# Patient Record
Sex: Female | Born: 1995 | Race: White | Hispanic: No | Marital: Single | Smoking: Light tobacco smoker
Health system: Southern US, Community
[De-identification: ages and names within clinical notes are randomized; demographics above are authoritative.]

## PROBLEM LIST (undated history)

## (undated) DIAGNOSIS — K5900 Constipation, unspecified: Secondary | ICD-10-CM

## (undated) DIAGNOSIS — K589 Irritable bowel syndrome without diarrhea: Secondary | ICD-10-CM

## (undated) DIAGNOSIS — R197 Diarrhea, unspecified: Secondary | ICD-10-CM

## (undated) DIAGNOSIS — B2790 Infectious mononucleosis, unspecified without complication: Secondary | ICD-10-CM

## (undated) HISTORY — DX: Diarrhea, unspecified: R19.7

## (undated) HISTORY — DX: Infectious mononucleosis, unspecified without complication: B27.90

## (undated) HISTORY — DX: Irritable bowel syndrome, unspecified: K58.9

## (undated) HISTORY — DX: Constipation, unspecified: K59.00

---
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
## 2017-06-26 ENCOUNTER — Telehealth: Payer: Self-pay | Admitting: Physician Assistant

## 2017-06-26 NOTE — Telephone Encounter (Signed)
Spoke to mother, patient had been originally scheduled with Dr. Adela LankArmbruster for a colonoscopy on 12/13/16, patient had to cancel due to being sick (mono). Patient is at camp until 8/6 and then will leave to go back to school the following week. I have scheduled a pre-visit on 8/7 [facility] Patient already has prep, just needs pre-visit instructions. I will let Amy H. know about rescheduled date to get prior auth from insurance if needed.

## 2017-07-16 ENCOUNTER — Ambulatory Visit (AMBULATORY_SURGERY_CENTER): Payer: Self-pay

## 2017-07-16 VITALS — Ht 64.0 in | Wt 134.8 lb

## 2017-07-16 DIAGNOSIS — R197 Diarrhea, unspecified: Secondary | ICD-10-CM

## 2017-07-16 NOTE — Progress Notes (Signed)
No allergies to eggs or soy No past exposure to anesthesia No diet meds No home oxygen  Registered emmi 

## 2017-07-17 ENCOUNTER — Ambulatory Visit (AMBULATORY_SURGERY_CENTER): Payer: BC Managed Care – PPO | Admitting: Gastroenterology

## 2017-07-17 VITALS — BP 103/65 | HR 85 | Temp 97.8°F | Resp 19 | Ht 64.0 in | Wt 134.0 lb

## 2017-07-17 DIAGNOSIS — R197 Diarrhea, unspecified: Secondary | ICD-10-CM | POA: Diagnosis not present

## 2017-07-17 MED ORDER — SODIUM CHLORIDE 0.9 % IV SOLN: 500.0000 mL | INTRAVENOUS | Status: DC

## 2017-07-17 NOTE — Progress Notes (Signed)
Pt's states no medical or surgical changes since previsit or office visit. 

## 2017-07-17 NOTE — Progress Notes (Signed)
Report to PACU, RN, vss, BBS= Clear.  

## 2017-07-17 NOTE — Progress Notes (Signed)
Called to room to assist during endoscopic procedure.  Patient ID and intended procedure confirmed with present staff. Received instructions for my participation in the procedure from the performing physician.  

## 2017-07-17 NOTE — Patient Instructions (Signed)
YOU HAD AN ENDOSCOPIC PROCEDURE TODAY AT THE [facility]:   Refer to the procedure report that was given to you for any specific questions about what was found during the examination.  If the procedure report does not answer your questions, please call your gastroenterologist to clarify.  If you requested that your care partner not be given the details of your procedure findings, then the procedure report has been included in a sealed envelope for you to review at your convenience later.  YOU SHOULD EXPECT: Some feelings of bloating in the abdomen. Passage of more gas than usual.  Walking can help get rid of the air that was put into your GI tract during the procedure and reduce the bloating. If you had a lower endoscopy (such as a colonoscopy or flexible sigmoidoscopy) you may notice spotting of blood in your stool or on the toilet paper. If you underwent a bowel prep for your procedure, you may not have a normal bowel movement for a few days.  Please Note:  You might notice some irritation and congestion in your nose or some drainage.  This is from the oxygen used during your procedure.  There is no need for concern and it should clear up in a day or so.  SYMPTOMS TO REPORT IMMEDIATELY:   Following lower endoscopy (colonoscopy or flexible sigmoidoscopy):  Excessive amounts of blood in the stool  Significant tenderness or worsening of abdominal pains  Swelling of the abdomen that is new, acute  Fever of 100F or higher   Following upper endoscopy (EGD)  Vomiting of blood or coffee ground material  New chest pain or pain under the shoulder blades  Painful or persistently difficult swallowing  New shortness of breath  Fever of 100F or higher  Black, tarry-looking stools  For urgent or emergent issues, a gastroenterologist can be reached at any hour by calling [phone].   DIET:  We do recommend a small meal at first, but then you may proceed to your regular diet.  Drink  plenty of fluids but you should avoid alcoholic beverages for 24 hours.  ACTIVITY:  You should plan to take it easy for the rest of today and you should NOT DRIVE or use heavy machinery until tomorrow (because of the sedation medicines used during the test).    FOLLOW UP: Our staff will call the number listed on your records the next business day following your procedure to check on you and address any questions or concerns that you may have regarding the information given to you following your procedure. If we do not reach you, we will leave a message.  However, if you are feeling well and you are not experiencing any problems, there is no need to return our call.  We will assume that you have returned to your regular daily activities without incident.  If any biopsies were taken you will be contacted by phone or by letter within the next 1-3 weeks.  Please call us at [phone] if you have not heard about the biopsies in 3 weeks.    SIGNATURES/CONFIDENTIALITY: You and/or your care partner have signed paperwork which will be entered into your electronic medical record.  These signatures attest to the fact that that the information above on your After Visit Summary has been reviewed and is understood.  Full responsibility of the confidentiality of this discharge information lies with you and/or your care-partner.  Hemorrhoid and low fodmap diet information given,  May use daily  fiber supplement.  Await pathology results.

## 2017-07-17 NOTE — Op Note (Signed)
[facility] Patient Name: Jordan HarrisKenzie Emel Procedure Date: 07/17/2017 7:34 AM MRN: [id number] Endoscopist: Viviann SpareSteven P. Armbruster MD, MD Age: [age] Referring MD:  Date of Birth: 09/03/1996 Gender: Female Account #: [id number] Procedure:                Colonoscopy Indications:              Chronic diarrhea, history of hematochezia Medicines:                Monitored Anesthesia Care Procedure:                Pre-Anesthesia Assessment:                           - Prior to the procedure, a History and Physical                            was performed, and patient medications and                            allergies were reviewed. The patient's tolerance of                            previous anesthesia was also reviewed. The risks                            and benefits of the procedure and the sedation                            options and risks were discussed with the patient.                            All questions were answered, and informed consent                            was obtained. Prior Anticoagulants: The patient has                            taken no previous anticoagulant or antiplatelet                            agents. ASA Grade Assessment: II - A patient with                            mild systemic disease. After reviewing the risks                            and benefits, the patient was deemed in                            satisfactory condition to undergo the procedure.                           After obtaining informed consent, the colonoscope  was passed under direct vision. Throughout the                            procedure, the patient's blood pressure, pulse, and                            oxygen saturations were monitored continuously. The                            Model PCF-H190DL [id number]) scope was introduced                            through the anus and advanced to the the terminal                            ileum,  with identification of the appendiceal                            orifice and IC valve. The colonoscopy was performed                            without difficulty. The patient tolerated the                            procedure well. The quality of the bowel                            preparation was good. The terminal ileum, ileocecal                            valve, appendiceal orifice, and rectum were                            photographed. Scope In: 7:38:55 AM Scope Out: 7:51:58 AM Scope Withdrawal Time: 0 hours 9 minutes 44 seconds  Total Procedure Duration: 0 hours 13 minutes 3 seconds  Findings:                 The perianal and digital rectal examinations were                            normal.                           The terminal ileum appeared normal.                           Internal hemorrhoids were found during                            retroflexion. The hemorrhoids were small.                           The exam was otherwise without abnormality. No  overt inflammatory changes.                           Biopsies for histology were taken with a cold                            forceps from the right colon, left colon and                            transverse colon for evaluation of microscopic                            colitis. Complications:            No immediate complications. Estimated blood loss:                            Minimal. Estimated Blood Loss:     Estimated blood loss was minimal. Impression:               - The examined portion of the ileum was normal.                           - Internal hemorrhoids.                           - The examination was otherwise normal.                           - Biopsies were taken with a cold forceps from the                            right colon, left colon and transverse colon for                            evaluation of microscopic colitis. Recommendation:           - Patient has a contact  number available for                            emergencies. The signs and symptoms of potential                            delayed complications were discussed with the                            patient. Return to normal activities tomorrow.                            Written discharge instructions were provided to the                            patient.                           - Resume previous diet.                           -  Continue present medications.                           - Trial of immodium as needed for diarrhea, bentyl                            10mg  every 8 hours as needed for cramping. Will                            discuss other medication options with the patient                           - Await pathology results. Viviann Spare P. Armbruster MD, MD 07/17/2017 7:56:39 AM This report has been signed electronically.

## 2017-07-18 ENCOUNTER — Telehealth: Payer: Self-pay | Admitting: *Deleted

## 2017-07-18 NOTE — Telephone Encounter (Signed)
  Follow up Call-  Call back number 07/17/2017  Post procedure Call Back phone  # [phone]  Permission to leave phone message Yes  Some recent data might be hidden     Patient questions:  Do you have a fever, pain , or abdominal swelling? No. Pain Score  0 *  Have you tolerated food without any problems? Yes.    Have you been able to return to your normal activities? Yes.    Do you have any questions about your discharge instructions: Diet   No. Medications  No. Follow up visit  No.  Do you have questions or concerns about your Care? No.  Actions: * If pain score is 4 or above: No action needed, pain <4.

## 2017-07-18 NOTE — Telephone Encounter (Signed)
  Follow up Call-  Call back number 07/17/2017  Post procedure Call Back phone  # [phone]  Permission to leave phone message Yes  Some recent data might be hidden     Patient questions:  Message left to call us if necessray.

## 2017-07-19 ENCOUNTER — Other Ambulatory Visit: Payer: Self-pay

## 2019-12-02 ENCOUNTER — Other Ambulatory Visit: Payer: Self-pay

## 2019-12-02 ENCOUNTER — Ambulatory Visit
Admission: EM | Admit: 2019-12-02 | Discharge: 2019-12-02 | Disposition: A | Discharge status: Home / Self Care | Payer: BC Managed Care – PPO | Attending: Family Medicine | Admitting: Family Medicine

## 2019-12-02 ENCOUNTER — Encounter: Payer: Self-pay | Admitting: Emergency Medicine

## 2019-12-02 DIAGNOSIS — N390 Urinary tract infection, site not specified: Secondary | ICD-10-CM

## 2019-12-02 DIAGNOSIS — N760 Acute vaginitis: Secondary | ICD-10-CM | POA: Diagnosis not present

## 2019-12-02 DIAGNOSIS — B9689 Other specified bacterial agents as the cause of diseases classified elsewhere: Secondary | ICD-10-CM

## 2019-12-02 DIAGNOSIS — Z113 Encounter for screening for infections with a predominantly sexual mode of transmission: Secondary | ICD-10-CM

## 2019-12-02 LAB — WET PREP, GENITAL
Sperm: NONE SEEN
Trich, Wet Prep: NONE SEEN
Yeast Wet Prep HPF POC: NONE SEEN

## 2019-12-02 LAB — URINALYSIS, COMPLETE (UACMP) WITH MICROSCOPIC
Bilirubin Urine: NEGATIVE
Glucose, UA: NEGATIVE mg/dL
Ketones, ur: NEGATIVE mg/dL
Nitrite: NEGATIVE
Protein, ur: NEGATIVE mg/dL
Specific Gravity, Urine: 1.015 (ref 1.005–1.030)
WBC, UA: >50 WBC/hpf (ref 0–5)
pH: 6 (ref 5.0–8.0)

## 2019-12-02 MED ORDER — METRONIDAZOLE 500 MG PO TABS: 500.0000 mg | ORAL_TABLET | Freq: Two times a day (BID) | ORAL | 0 refills | Status: DC

## 2019-12-02 MED ORDER — SULFAMETHOXAZOLE-TRIMETHOPRIM 800-160 MG PO TABS: 1.0000 | ORAL_TABLET | Freq: Two times a day (BID) | ORAL | 0 refills | Status: AC

## 2019-12-02 MED ORDER — FLUCONAZOLE 150 MG PO TABS: ORAL_TABLET | ORAL | 0 refills | Status: DC

## 2019-12-02 NOTE — Discharge Instructions (Addendum)
It was very nice seeing you today in clinic. Thank you for entrusting me with your care.   As discussed, your urine is POSITIVE for infection and your swab was POSITIVE for BV. Will approach treatment as follows:  Prescription has been sent to your pharmacy for antibiotics.  Please pick up and take as directed. FINISH the entire course of medication even if you are feeling better.  NO ALCOHOL while on this medication.  A culture will be sent on your provided sample. If it comes back resistant to what I have prescribed you, someone will call you and let you know that we will need to change antibiotics. Increase fluid intake as much as possible to flush your urinary tract.  Water is always the best.  Avoid caffeine until your infection clears up, as it can contribute to painful bladder spasms.  May use Tylenol and/or Ibuprofen as needed for pain/fever. May use Azo products (over the counter) to help with pain/discomfort.  STD testing has been sent. You will be contacted with further directives if positive.   Make arrangements to follow up with your regular doctor in 1 week for re-evaluation. If your symptoms/condition worsens, please seek follow up care either here or in the ER. Please remember, our [place] providers are "right here with you" when you need [place].   Again, it was my pleasure to take care of you today. Thank you for choosing our clinic. I hope that you start to feel better quickly.   Honor Loh, MSN, APRN, FNP-C, CEN Advanced Practice Provider [facility]

## 2019-12-02 NOTE — ED Triage Notes (Addendum)
Patient c/o dysuria and urinary frequency that started 1 week ago.  Patient also requesting to be tested for STDs

## 2019-12-04 LAB — URINE CULTURE: Culture: NO GROWTH

## 2019-12-04 LAB — GC/CHLAMYDIA PROBE AMP
Chlamydia trachomatis, NAA: POSITIVE — AB
Neisseria Gonorrhoeae by PCR: NEGATIVE

## 2019-12-04 NOTE — ED Provider Notes (Signed)
[place], [place]   Name: Jordan Rojas DOB: [birth year] MRN: [id number] CSN: [id number] PCP: Jackelyn Poling, MD  Arrival date and time:  12/02/19 1323  Chief Complaint:  Dysuria (APPT) and Urinary Frequency   NOTE: Prior to seeing the patient today, I have reviewed the triage nursing documentation and vital signs. Clinical staff has updated patient's PMH/PSHx, current medication list, and drug allergies/intolerances to ensure comprehensive history available to assist in medical decision making.   History:   HPI: Jordan Rojas is a 23 y.o. female who presents today with complaints of urinary symptoms that began approximately 1 week ago. She complains of dysuria, frequency, and urgency. She has not appreciated any gross hematuria, nor has she noticed her urine being malodorous. Patient denies any associated nausea, vomiting, fever, or chills. She has not experienced any pain in her lower back or flank area. She does not some suprapubic pressure. Patient advises that she does not have a past medical history that is significant for recurrent urinary tract infections. Patient endorses that she engages in unprotected sexual activity. She is requesting to be tested for STIs today as well while she is here. She denies any vaginal pain or bleeding. She has had some vaginal discharge that has been clear and odorless. LMP unknown as patient has IUD in place. She denies concerns for current pregnancy.   Past Medical History:  Diagnosis Date  . Constipation   . Diarrhea   . IBS (irritable bowel syndrome)   . Mononucleosis     History reviewed. No pertinent surgical history.  Family History  Problem Relation Age of Onset  . Hyperlipidemia Father   . Heart disease Father   . Healthy Mother   . Breast cancer Maternal Grandmother   . Breast cancer Paternal Grandmother   . Liver cancer Paternal Grandfather   . Diabetes Neg Hx   . Heart attack Neg Hx   . Hypertension Neg Hx   . Sudden  death Neg Hx   . Colon cancer Neg Hx   . Stomach cancer Neg Hx   . Rectal cancer Neg Hx   . Esophageal cancer Neg Hx     Social History   Tobacco Use  . Smoking status: Light Tobacco Smoker    Types: Cigarettes  . Smokeless tobacco: Never Used  Substance Use Topics  . Alcohol use: Yes    Comment: socially  . Drug use: No    Patient Active Problem List   Diagnosis Date Noted  . Shin splints 09/30/2012    Home Medications:    No outpatient medications have been marked as taking for the 12/02/19 encounter [facility] Encounter).    Allergies:   Penicillins  Review of Systems (ROS): Review of Systems  Constitutional: Negative for chills and fever.  Respiratory: Negative for cough and shortness of breath.   Cardiovascular: Negative for chest pain and palpitations.  Gastrointestinal: Positive for abdominal pain. Negative for nausea and vomiting.  Genitourinary: Positive for dysuria, frequency, urgency and vaginal discharge. Negative for decreased urine volume, hematuria, pelvic pain, vaginal bleeding and vaginal pain.  Musculoskeletal: Negative for back pain.  Skin: Negative for color change, pallor and rash.  Neurological: Negative for dizziness, syncope, weakness and headaches.  All other systems reviewed and are negative.    Vital Signs: Today's Vitals   12/02/19 1409 12/02/19 1410  BP:  124/82  Pulse:  84  Resp:  18  Temp:  98.2 F (36.8 C)  TempSrc:  Oral  SpO2:  100%  Weight: 140 lb (63.5 kg)   Height: 5\' 4"  (1.626 m)   PainSc: 0-No pain     Physical Exam: Physical Exam  Constitutional: She is oriented to person, place, and time and well-developed, well-nourished, and in no distress.  HENT:  Head: Normocephalic and atraumatic.  Mouth/Throat: Mucous membranes are normal.  Eyes: Pupils are equal, round, and reactive to light. EOM are normal.  Neck: No tracheal deviation present.  Cardiovascular: Normal rate, regular rhythm, normal heart sounds and  intact distal pulses. Exam reveals no gallop and no friction rub.  No murmur heard. Pulmonary/Chest: Effort normal and breath sounds normal. No respiratory distress. She has no wheezes. She has no rales.  Abdominal: Soft. Normal appearance, normal aorta and bowel sounds are normal. There is abdominal tenderness in the suprapubic area. There is no CVA tenderness.  Genitourinary:    Genitourinary Comments: Exam deferred. No vaginal/pelvic pain or bleeding. Patient is not currently pregnant. She has elected to self collect specimen swab for wet prep and DNA probe for GC.   Musculoskeletal:     Cervical back: Normal range of motion and neck supple.  Neurological: She is alert and oriented to person, place, and time. Gait normal.  Skin: Skin is warm and dry. No rash noted.  Psychiatric: Mood, memory, affect and judgment normal.  Nursing note and vitals reviewed.   Urgent Care Treatments / Results:   Orders Placed This Encounter  Procedures  . GC/Chlamydia Probe Amp  . Wet prep, genital  . Urine culture  . Urinalysis, Complete w Microscopic    LABS: PLEASE NOTE: all labs that were ordered this encounter are listed, however only abnormal results are displayed. Labs Reviewed  WET PREP, GENITAL - Abnormal; Notable for the following components:      Result Value   Clue Cells Wet Prep HPF POC PRESENT (*)    WBC, Wet Prep HPF POC MANY (*)    All other components within normal limits  URINALYSIS, COMPLETE (UACMP) WITH MICROSCOPIC - Abnormal; Notable for the following components:   APPearance HAZY (*)    Hgb urine dipstick TRACE (*)    Leukocytes,Ua TRACE (*)    Bacteria, UA MANY (*)    All other components within normal limits  GC/CHLAMYDIA PROBE AMP  URINE CULTURE    EKG: -None  RADIOLOGY: No results found.  PROCEDURES: Procedures  MEDICATIONS RECEIVED THIS VISIT: Medications - No data to display  PERTINENT CLINICAL COURSE NOTES/UPDATES:   Initial Impression / Assessment  and Plan / Urgent Care Course:  Pertinent labs & imaging results that were available during my care of the patient were personally reviewed by me and considered in my medical decision making (see lab/imaging section of note for values and interpretations).  Jordan Rojas is a 23 y.o. female who presents to [facility] today with complaints of Dysuria (APPT) and Urinary Frequency   Patient is well appearing overall in clinic today. She does not appear to be in any acute distress. Presenting symptoms (see HPI) and exam as documented above. Persistent urinary symptoms and vaginal discharge x 1 week. No fevers or abdominal pain. Workup and treatment as follows:   Urine HCG (-) for pregnancy.   UA was (+) for infection; reflex culture sent. Will treat with a 5 day course of SMZ-TMP. Patient encouraged to complete the entire course of antibiotics even if she begins to feel better. She was advised that if culture demonstrates resistance to the prescribed antibiotic, she will  be contacted and advised of the need to change the antibiotic being used to treat her infection. Patient encouraged to increase her fluid intake as much as possible. Discussed that water is always best to flush the urinary tract. She was advised to avoid caffeine containing fluids until her infections clears, as caffeine can cause her to experience painful bladder spasms. May use Tylenol and/or Ibuprofen as needed for pain/fever. May also use over the counter phenazopyridine to help relieve her current urinary pain.   DNA probe for GC collected and sent to the lab for testing. Patient aware that she will be contacted with further treatment directives should her testing result as positive.    Wet prep (+) for clue cells, which is consistent with bacterial vaginosis (BV) infection. Will treat with a 7 day course of oral metronidazole. Patient encouraged to complete the entire course of antibiotics even if her symptoms  improve/resolve. Educated on need to avoid all ETOH while she is taking this medication in order to prevent a disulfiram like reaction that will result in significant nausea and vomiting.    Patient has has a history of vulvovaginal candidiasis in the past while on oral antimicrobial therapy. Will send in prophylactic fluconazole dose (150 mg x 1 - may repeat in 72 hours if still symptomatic) for patient to use should she develop symptoms.  Discussed follow up with primary care physician in 1 week for re-evaluation. I have reviewed the follow up and strict return precautions for any new or worsening symptoms. Patient is aware of symptoms that would be deemed urgent/emergent, and would thus require further evaluation either here or in the emergency department. At the time of discharge, she verbalized understanding and consent with the discharge plan as it was reviewed with her. All questions were fielded by provider and/or clinic staff prior to patient discharge.    Final Clinical Impressions / Urgent Care Diagnoses:   Final diagnoses:  BV (bacterial vaginosis)  Urinary tract infection without hematuria, site unspecified  Screen for STD (sexually transmitted disease)    New Prescriptions:  [place] Controlled Substance Registry consulted? Not Applicable  Meds ordered this encounter  Medications  . metroNIDAZOLE (FLAGYL) 500 MG tablet    Sig: Take 1 tablet (500 mg total) by mouth 2 (two) times daily.    Dispense:  14 tablet    Refill:  0  . fluconazole (DIFLUCAN) 150 MG tablet    Sig: Take 1 tablet (150 mg) PO x 1 dose. May repeat 150 mg dose in 3 days if still symptomatic.    Dispense:  2 tablet    Refill:  0  . sulfamethoxazole-trimethoprim (BACTRIM DS) 800-160 MG tablet    Sig: Take 1 tablet by mouth 2 (two) times daily for 5 days.    Dispense:  10 tablet    Refill:  0    Recommended Follow up Care:  Patient encouraged to follow up with the following provider within the specified time  frame, or sooner as dictated by the severity of her symptoms. As always, she was instructed that for any urgent/emergent care needs, she should seek care either here or in the emergency department for more immediate evaluation.  Follow-up Information    Jackelyn PolingBonney, Warren K, MD In 1 week.   Specialty: Pediatrics Why: General reassessment of symptoms if not improving Contact information: [address] [place] [address] [phone]         NOTE: This note was prepared using Dragon dictation software along with  smaller phrase technology. Despite my best ability to proofread, there is the potential that transcriptional errors may still occur from this process, and are completely unintentional.    Karen Kitchens, NP 12/04/19 0127

## 2019-12-07 ENCOUNTER — Telehealth: Payer: Self-pay | Admitting: Emergency Medicine

## 2019-12-07 MED ORDER — ONDANSETRON 4 MG PO TBDP: 4.0000 mg | ORAL_TABLET | Freq: Three times a day (TID) | ORAL | 0 refills | Status: DC | PRN

## 2019-12-07 MED ORDER — AZITHROMYCIN 250 MG PO TABS: 1000.0000 mg | ORAL_TABLET | Freq: Once | ORAL | 0 refills | Status: AC

## 2019-12-07 NOTE — Telephone Encounter (Signed)
Patient contacted by phone and made aware of  cytology  results. Pt verbalized understanding and had all questions answered.    

## 2019-12-07 NOTE — Telephone Encounter (Signed)
Pt called stating the flagyl was making her sick. Denies drinking and states she takes it with a full stomach. Okay to send zofran per [facility] NP.

## 2019-12-07 NOTE — Telephone Encounter (Signed)
Chlamydia is positive.  Rx po zithromax 1g #1 dose no refills was sent to the pharmacy of record.  Pt needs education to please refrain from sexual intercourse for 7 days to give the medicine time to work, sexual partners need to be notified and tested/treated.  Condoms may reduce risk of reinfection.  Recheck or followup with PCP for further evaluation if symptoms are not improving.   [facility] notified.  Attempted to reach patient. No answer at this time. Voicemail left.   Called and left Vm on both numbers.

## 2019-12-07 NOTE — Addendum Note (Signed)
Addended by: Ocie Bob R on: 12/07/2019 11:27 AM   Modules accepted: Orders

## 2021-01-21 ENCOUNTER — Encounter: Payer: Self-pay | Admitting: Emergency Medicine

## 2021-01-21 ENCOUNTER — Other Ambulatory Visit: Payer: Self-pay

## 2021-01-21 ENCOUNTER — Emergency Department
Admission: EM | Admit: 2021-01-21 | Discharge: 2021-01-22 | Disposition: A | Discharge status: Home / Self Care | Payer: BC Managed Care – PPO | Attending: Emergency Medicine | Admitting: Emergency Medicine

## 2021-01-21 DIAGNOSIS — Z79899 Other long term (current) drug therapy: Secondary | ICD-10-CM | POA: Diagnosis not present

## 2021-01-21 DIAGNOSIS — F13920 Sedative, hypnotic or anxiolytic use, unspecified with intoxication, uncomplicated: Secondary | ICD-10-CM | POA: Diagnosis not present

## 2021-01-21 DIAGNOSIS — R4182 Altered mental status, unspecified: Secondary | ICD-10-CM | POA: Diagnosis present

## 2021-01-21 DIAGNOSIS — F17210 Nicotine dependence, cigarettes, uncomplicated: Secondary | ICD-10-CM | POA: Diagnosis not present

## 2021-01-21 DIAGNOSIS — F19929 Other psychoactive substance use, unspecified with intoxication, unspecified: Secondary | ICD-10-CM

## 2021-01-21 LAB — COMPREHENSIVE METABOLIC PANEL
ALT: 20 U/L (ref 0–44)
AST: 24 U/L (ref 15–41)
Albumin: 4.5 g/dL (ref 3.5–5.0)
Alkaline Phosphatase: 43 U/L (ref 38–126)
Anion gap: 8 (ref 5–15)
BUN: 13 mg/dL (ref 6–20)
CO2: 24 mmol/L (ref 22–32)
Calcium: 9.1 mg/dL (ref 8.9–10.3)
Chloride: 105 mmol/L (ref 98–111)
Creatinine, Ser: 0.84 mg/dL (ref 0.44–1.00)
GFR, Estimated: >60 mL/min (ref >60)
Glucose, Bld: 127 mg/dL — ABNORMAL HIGH (ref 70–99)
Potassium: 3.8 mmol/L (ref 3.5–5.1)
Sodium: 137 mmol/L (ref 135–145)
Total Bilirubin: 0.5 mg/dL (ref 0.3–1.2)
Total Protein: 7.9 g/dL (ref 6.5–8.1)

## 2021-01-21 LAB — CBC
HCT: 40.6 % (ref 36.0–46.0)
Hemoglobin: 13.5 g/dL (ref 12.0–15.0)
MCH: 29.9 pg (ref 26.0–34.0)
MCHC: 33.3 g/dL (ref 30.0–36.0)
MCV: 90 fL (ref 80.0–100.0)
Platelets: 388 10*3/uL (ref 150–400)
RBC: 4.51 MIL/uL (ref 3.87–5.11)
RDW: 12.5 % (ref 11.5–15.5)
WBC: 11.9 10*3/uL — ABNORMAL HIGH (ref 4.0–10.5)
nRBC: 0 % (ref 0.0–0.2)

## 2021-01-21 LAB — ETHANOL: Alcohol, Ethyl (B): <10 mg/dL (ref <10)

## 2021-01-21 MED ORDER — SODIUM CHLORIDE 0.9 % IV BOLUS
1000.0000 mL | Freq: Once | INTRAVENOUS | Status: AC
  Administered 2021-01-21: 1000 mL via INTRAVENOUS

## 2021-01-21 NOTE — ED Provider Notes (Signed)
[facility] Emergency Department Provider Note  Time seen: 10:17 PM  I have reviewed the triage vital signs and the nursing notes.   HISTORY  Chief Complaint Altered Mental Status and Near Syncope   HPI Jordan Rojas is a 25 y.o. female with no significant past medical history presents to the emergency department where she was found at her work very somnolent and tearful. According to the patient she took a Xanax bar from a coworker who offered it to her. Patient denies any other substances. Denies any alcohol. Patient is tearful, she is somnolent but will awaken to voice and answer questions briefly before falling back asleep. Patient states she does not normally take Xanax. Mom is here with the patient.  Past Medical History:  Diagnosis Date  . Constipation   . Diarrhea   . IBS (irritable bowel syndrome)   . Mononucleosis     Patient Active Problem List   Diagnosis Date Noted  . Shin splints 09/30/2012    History reviewed. No pertinent surgical history.  Prior to Admission medications   Medication Sig Start Date End Date Taking? Authorizing Provider  atomoxetine (STRATTERA) 100 MG capsule Take 100 mg by mouth every morning. 12/12/20  Yes [provider]  traZODone (DESYREL) 100 MG tablet TAKE 1 TO 2 TABLETS BY MOUTH AT BEDTIME AS NEEDED FOR INSOMNIA 01/11/21  Yes [provider]  VYVANSE 20 MG capsule Take 20 mg by mouth every morning. 01/09/21  Yes [provider]    Allergies  Allergen Reactions  . Penicillins Rash    Family History  Problem Relation Age of Onset  . Hyperlipidemia Father   . Heart disease Father   . Healthy Mother   . Breast cancer Maternal Grandmother   . Breast cancer Paternal Grandmother   . Liver cancer Paternal Grandfather   . Diabetes Neg Hx   . Heart attack Neg Hx   . Hypertension Neg Hx   . Sudden death Neg Hx   . Colon cancer Neg Hx   . Stomach cancer Neg Hx   . Rectal cancer Neg Hx    . Esophageal cancer Neg Hx     Social History Social History   Tobacco Use  . Smoking status: Light Tobacco Smoker    Types: Cigarettes  . Smokeless tobacco: Never Used  Vaping Use  . Vaping Use: Every day  Substance Use Topics  . Alcohol use: Yes    Comment: socially  . Drug use: No    Review of Systems Unable to obtain adequate/accurate review of systems due to altered mental status/somnolence.  ____________________________________________   PHYSICAL EXAM:  VITAL SIGNS: ED Triage Vitals  Enc Vitals Group     BP 01/21/21 2112 (!) 140/103     Pulse Rate 01/21/21 2112 (!) 139     Resp 01/21/21 2112 16     Temp 01/21/21 2112 98.6 F (37 C)     Temp Source 01/21/21 2112 Oral     SpO2 01/21/21 2112 96 %     Weight 01/21/21 2115 150 lb (68 kg)     Height 01/21/21 2115 5\' 6"  (1.676 m)     Head Circumference --      Peak Flow --      Pain Score 01/21/21 2115 0     Pain Loc --      Pain Edu? --      Excl. in GC? --    Constitutional: Patient is somnolent but does awaken  to voice will briefly answer questions before falling back asleep. Tearful at times. Eyes: Normal exam ENT      Head: Normocephalic and atraumatic.      Mouth/Throat: Dry appearing mucous membranes. Cardiovascular: Normal rate, regular rhythm.  Respiratory: Normal respiratory effort without tachypnea nor retractions. Breath sounds are clear  Gastrointestinal: Soft and nontender. No distention.  Musculoskeletal: Nontender with normal range of motion in all extremities Neurologic: Slurred speech. Moves all extremities. No gross focal neurologic deficits Skin:  Skin is warm, dry and intact.  Psychiatric: Somnolent, tearful  ____________________________________________    EKG  EKG viewed and interpreted by myself shows sinus tachycardia 147 bpm with a narrow QRS, normal axis, QTC prolongation otherwise normal intervals, nonspecific ST  changes.  ____________________________________________   INITIAL IMPRESSION / ASSESSMENT AND PLAN / ED COURSE  Pertinent labs & imaging results that were available during my care of the patient were reviewed by me and considered in my medical decision making (see chart for details).   Patient presents to the emergency department after taking Xanax from a coworker at work. Patient denies any other substances. Patient is quite somnolent but will awaken briefly to voice answer questions before falling back to sleep. Patient tearful at times. Mom is here with the patient. Patient is quite tachycardic currently 120 to 130 bpm during my evaluation. We will IV hydrate. Ideally I would like to obtain a urine sample as well as check a urine drug screen. Patient's ethanol level is zero. Patient and mom agreeable to plan of care. We will monitor in the emergency department on cardiac monitoring as a precaution.  Patient care signed out to oncoming provider.  Jordan Rojas was evaluated in Emergency Department on 01/21/2021 for the symptoms described in the history of present illness. She was evaluated in the context of the global COVID-19 pandemic, which necessitated consideration that the patient might be at risk for infection with the SARS-CoV-2 virus that causes COVID-19. Institutional protocols and algorithms that pertain to the evaluation of patients at risk for COVID-19 are in a state of rapid change based on information released by regulatory bodies including the CDC and federal and state organizations. These policies and algorithms were followed during the patient's care in the ED.  ____________________________________________   FINAL CLINICAL IMPRESSION(S) / ED DIAGNOSES  Benzodiazepine intoxication   Minna Antis, MD 01/21/21 2224

## 2021-01-21 NOTE — ED Notes (Addendum)
Patient in room, alert and oriented with slow responses. Patient appears drowsy. Patient states she took a bar of xanax and does not normally take xanax. Patient's mother remains at bedside at this time. Patient made aware of need for urine sample and states she understands. Call button within reach, will continue to monitor.

## 2021-01-21 NOTE — ED Notes (Signed)
Per first RN friends say pt took xanax, pt repeatedly denies drug abuse

## 2021-01-21 NOTE — ED Notes (Signed)
Father to lobby reports to this RN  That last year pt had to receive six narcan from the police last Valentines Day and then spent 3 weeks in rehab, "my wife found these pills and she [pt] said they were diet pills" pictures are of red pill without identifiable markings

## 2021-01-21 NOTE — ED Notes (Signed)
Patient to waiting room via wheelchair by EMS from work.  EMS reports patient was found in break room by co-worker unresponsive.  Patient denied taking any thing, EMS reports pin point pupils.  EMS intervention --  HR 150's (EKG showing sinus tach),       BP 148/105, pulse oxi 99% on room air, cbg 134.

## 2021-01-21 NOTE — ED Triage Notes (Signed)
Patient from work ([employer]) where she was found sleepy in the the break [employer] with complaints of XXX   Pt very sleepy, unable to answer questions reliably, tearful, reports "being overwhelmed"

## 2021-01-22 LAB — URINALYSIS, COMPLETE (UACMP) WITH MICROSCOPIC
Bilirubin Urine: NEGATIVE
Glucose, UA: NEGATIVE mg/dL
Hgb urine dipstick: NEGATIVE
Ketones, ur: 5 mg/dL — AB
Leukocytes,Ua: NEGATIVE
Nitrite: NEGATIVE
Protein, ur: NEGATIVE mg/dL
Specific Gravity, Urine: 1.021 (ref 1.005–1.030)
pH: 5 (ref 5.0–8.0)

## 2021-01-22 LAB — URINE DRUG SCREEN, QUALITATIVE (ARMC ONLY)
Amphetamines, Ur Screen: POSITIVE — AB
Barbiturates, Ur Screen: NOT DETECTED
Benzodiazepine, Ur Scrn: POSITIVE — AB
Cannabinoid 50 Ng, Ur ~~LOC~~: POSITIVE — AB
Cocaine Metabolite,Ur ~~LOC~~: NOT DETECTED
MDMA (Ecstasy)Ur Screen: NOT DETECTED
Methadone Scn, Ur: NOT DETECTED
Opiate, Ur Screen: POSITIVE — AB
Phencyclidine (PCP) Ur S: NOT DETECTED
Tricyclic, Ur Screen: NOT DETECTED

## 2021-01-22 LAB — POC URINE PREG, ED: Preg Test, Ur: NEGATIVE

## 2021-01-22 NOTE — Discharge Instructions (Addendum)
Make sure you are staying with the family member this evening so they can watch over you.  If you are having any trouble breathing or if you feel that you are getting more sedated please return to the emergency room.  Otherwise make sure to drink plenty of fluids tomorrow.  Avoid alcohol or drugs.  Follow-up with your primary care doctor in 2 days.

## 2021-01-22 NOTE — ED Provider Notes (Signed)
Patient clinically sober, ambulated with no difficulty.  Normal work of breathing and normal sats.  Ambulating with steady gait.  Tolerating p.o.  Mother has been here at bedside the whole time.  Discussed with the patient results of her tox screen with her positive for amphetamines, opiates, cannabinoids, and benzos.  Patient will be discharged home to the care of her mother.   Nita Sickle, MD 01/22/21 [id number]

## 2021-01-22 NOTE — ED Notes (Signed)
Patient much more alert at this time sitting up in bed on cellphone. Denies pain or further needs at this time.
